# Patient Record
Sex: Male | Born: 1955 | Race: Black or African American | Hispanic: No | Marital: Married | Smoking: Never smoker
Health system: Southern US, Community
[De-identification: ages and names within clinical notes are randomized; demographics above are authoritative.]

## PROBLEM LIST (undated history)

## (undated) DIAGNOSIS — G4730 Sleep apnea, unspecified: Secondary | ICD-10-CM

## (undated) DIAGNOSIS — E785 Hyperlipidemia, unspecified: Secondary | ICD-10-CM

## (undated) HISTORY — DX: Hyperlipidemia, unspecified: E78.5

---

## 2004-06-17 ENCOUNTER — Encounter: Admission: RE | Admit: 2004-06-17 | Discharge: 2004-06-17 | Payer: Self-pay | Admitting: Family Medicine

## 2004-10-27 ENCOUNTER — Ambulatory Visit (HOSPITAL_COMMUNITY): Admission: RE | Admit: 2004-10-27 | Discharge: 2004-10-27 | Payer: Self-pay | Admitting: Gastroenterology

## 2009-07-05 ENCOUNTER — Ambulatory Visit: Payer: Self-pay | Admitting: Vascular Surgery

## 2010-10-03 ENCOUNTER — Encounter: Admission: RE | Admit: 2010-10-03 | Discharge: 2010-10-03 | Payer: Self-pay | Admitting: Family Medicine

## 2011-04-25 NOTE — Procedures (Signed)
DUPLEX DEEP VENOUS EXAM - LOWER EXTREMITY   INDICATION:  Painful veins in left lower extremity.   HISTORY:  Edema:  No.  Trauma/Surgery:  No.  Pain:  Patient complains of occasional burning sensation in the inner  left calf and thigh area.  PE:  No.  Previous DVT:  No.  Anticoagulants:  Other:   DUPLEX EXAM:                CFV   SFV   PopV  PTV    GSV                R  L  R  L  R  L  R   L  R  L  Thrombosis    o  o     o     o      o     o  Spontaneous   +  +     +     +      +     +  Phasic        +  +     +     +      +     +  Augmentation  +  +     +     +      +     +  Compressible  +  +     +     +      +     +  Competent     0  0     0     +      +     0   Legend:  + - yes  o - no  p - partial  D - decreased   IMPRESSION:  1. No evidence of deep vein thrombosis noted in the left lower      extremity.  2. Reflux is noted in the left greater saphenous, superficial femoral,      and bilateral common femoral veins.  3. A preliminary report was faxed to Dr. Randel Books office on      07/05/2009.        _____________________________  Larina Earthly, M.D.   CH/MEDQ  D:  07/05/2009  T:  07/06/2009  Job:  [id number]

## 2011-04-28 NOTE — Op Note (Signed)
NAME:  DODDSamantha, Olivera NO.:  [id number]   MEDICAL RECORD NO.:  [id number]          PATIENT TYPE:  AMB   LOCATION:  ENDO                         FACILITY:  [facility]   PHYSICIAN:  Danise Edge, M.D.   DATE OF BIRTH:  1956/09/07   DATE OF PROCEDURE:  10/27/2004  DATE OF DISCHARGE:                                 OPERATIVE REPORT   PROCEDURE:  Colonoscopy.   PROCEDURE INDICATION:  Mr. Ethon Wymer is a 55 year old male born April 01, 1956.  Mr. Fennell has a 10-year history of chronic constipation associated  with straining and the passage of fresh blood intermittently.  He has tried  a number of laxatives but nothing consistently.  Since switching from  caffeinated coffee to herbal tea, his constipation has markedly improved.  There is no personal or family history of colon cancer.   MEDICATION ALLERGIES:  None.   CURRENT MEDICATIONS:  Lipitor and aspirin.   PAST MEDICAL HISTORY:  Hypercholesterolemia.   SOCIAL HISTORY:  Mr. Ysaguirre is married and has two children.   HABITS:  Mr. Gover does not smoke cigarettes and consumes alcohol in  moderation.   ENDOSCOPIST:  Danise Edge, M.D.   PREMEDICATION:  Versed 7.5 mg, Demerol 50 mg.   PROCEDURE:  After obtaining informed consent, Mr. Meldrum was placed in the  left lateral decubitus position.  I administered intravenous Demerol and  intravenous Versed to achieve conscious sedation for the procedure.  The  patient's blood pressure, oxygen saturation, and cardiac rhythm were  monitored throughout the procedure and documented in the medical record.   Anal inspection and digital rectal exam were normal.  The prostate was non-  nodular.  The Olympus adjustable pediatric colonoscope was introduced into  the rectum and advanced to the cecum.  A normal-appearing ileocecal valve  was intubated and the distal ileum inspected.  Colonic preparation for the  exam today was excellent.   Rectum normal.   Sigmoid colon and  descending colon normal.   Splenic flexure normal.   Transverse colon normal.   Hepatic flexure normal.   Ascending colon normal.   Cecum and ileocecal valve normal.   Distal ileum normal.   ASSESSMENT:  Normal screening proctocolonoscopy to the cecum.  No endoscopic  evidence for the presence of colorectal neoplasia.      MJ/MEDQ  D:  10/27/2004  T:  10/27/2004  Job:  [id number]   cc:   Bryan Lemma. Manus Gunning, M.D.  [address] [place]  [place] [address]  Fax: [phone]

## 2011-06-28 IMAGING — US US EXTREM UP*L* LTD
1 series · 7 of 7 positions shown · non-contrast
Comparison: None.

CLINICAL DATA: Probable area in the left antecubital fossa

ULTRASOUND LEFT UPPER EXTREMITY COMPLETE
TECHNIQUE: Ultrasound examination of the left antecubital fossa
was performed including evaluation of the muscles, tendons, joint,
and adjacent soft tissues.

[Series 1: us extrem up*left* ltd · 0.05mm/px · 7 of 7 slices shown]
[im 1/7]
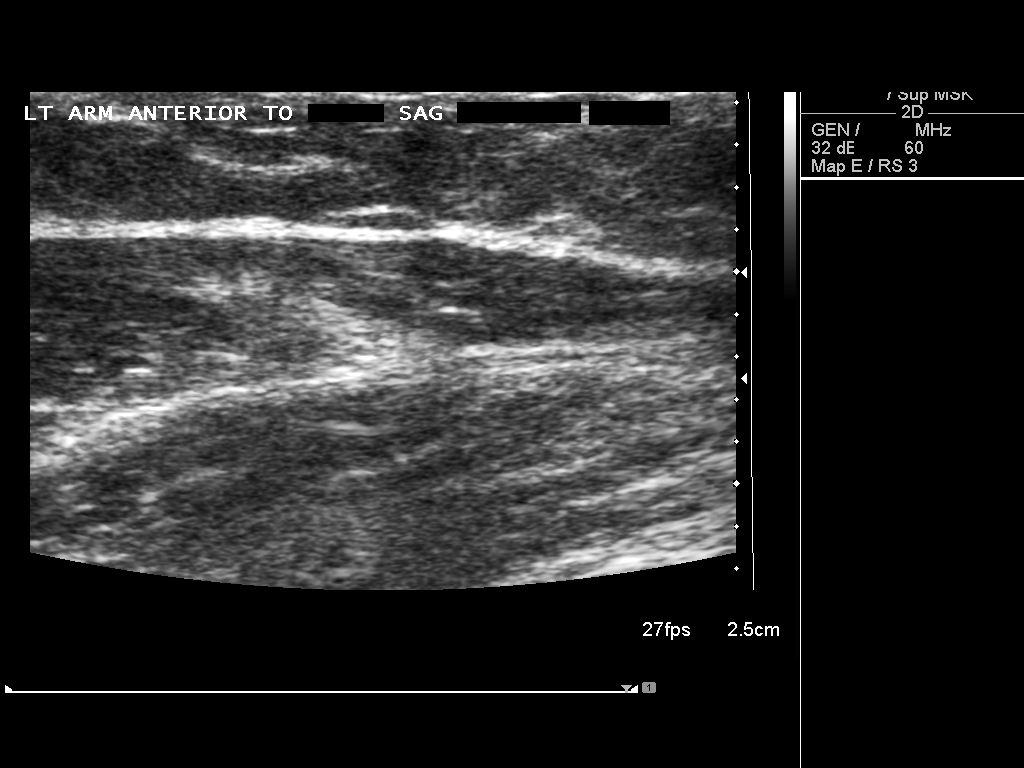
[im 2/7]
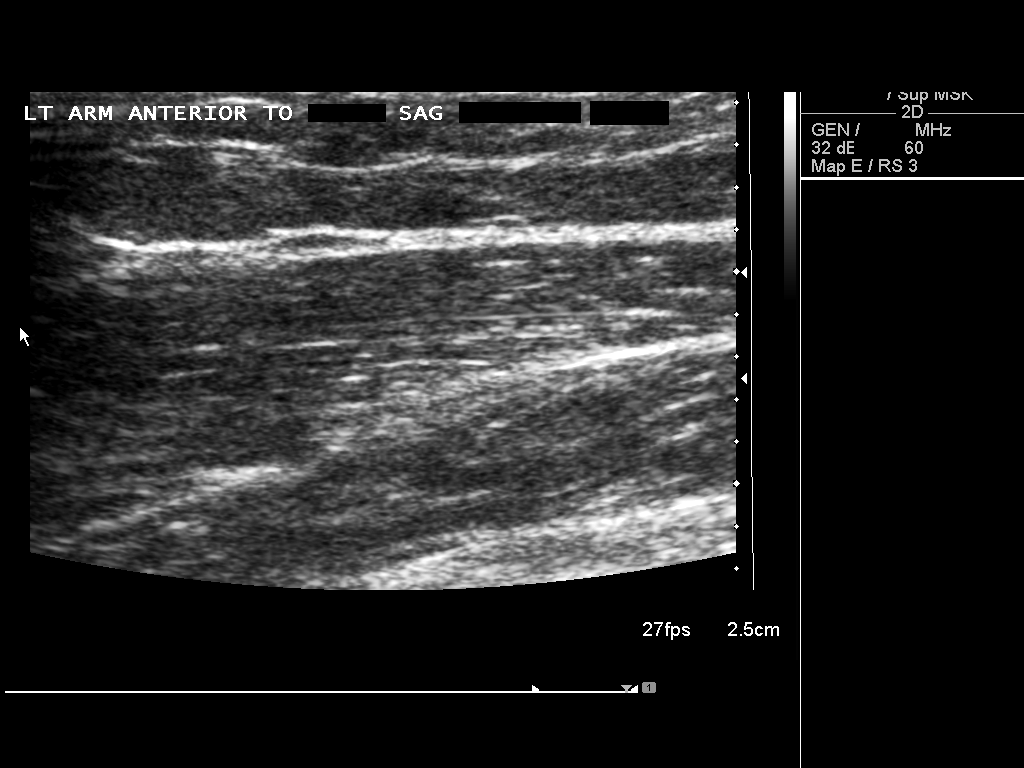
[im 3/7]
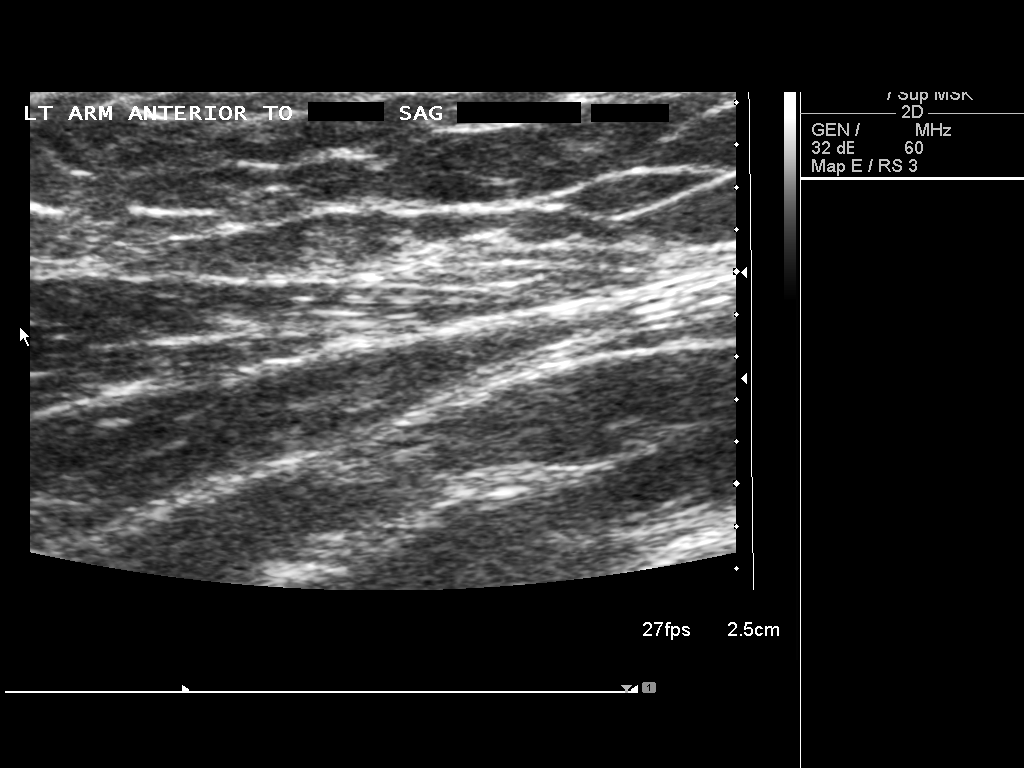
[im 4/7]
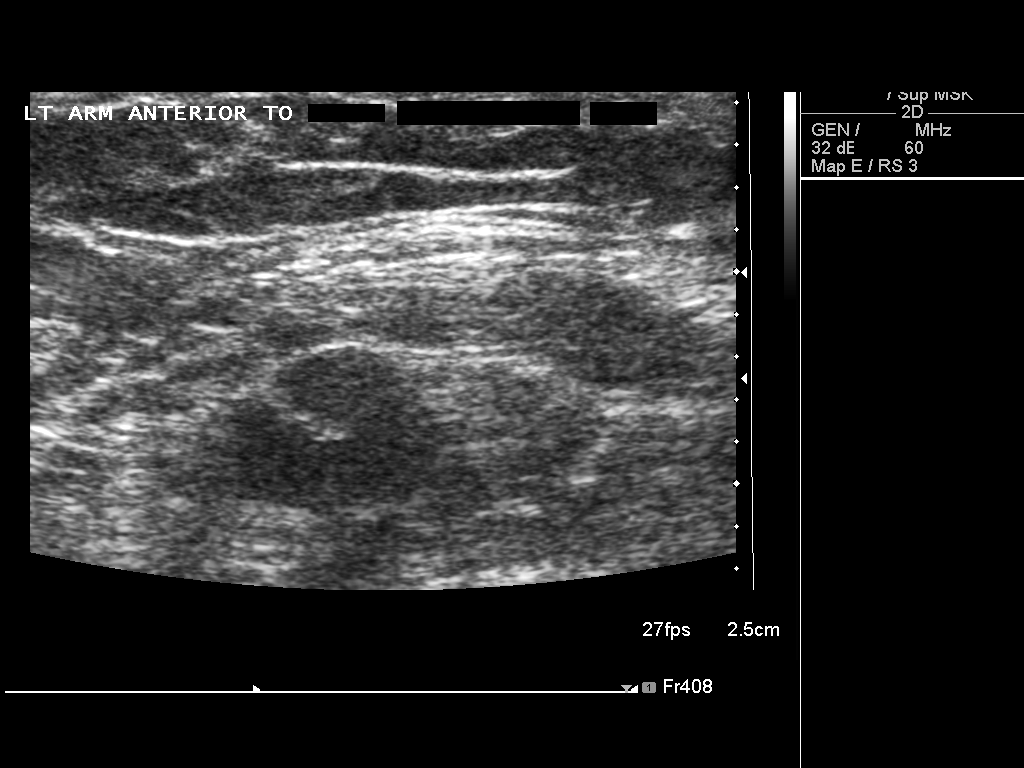
[im 5/7]
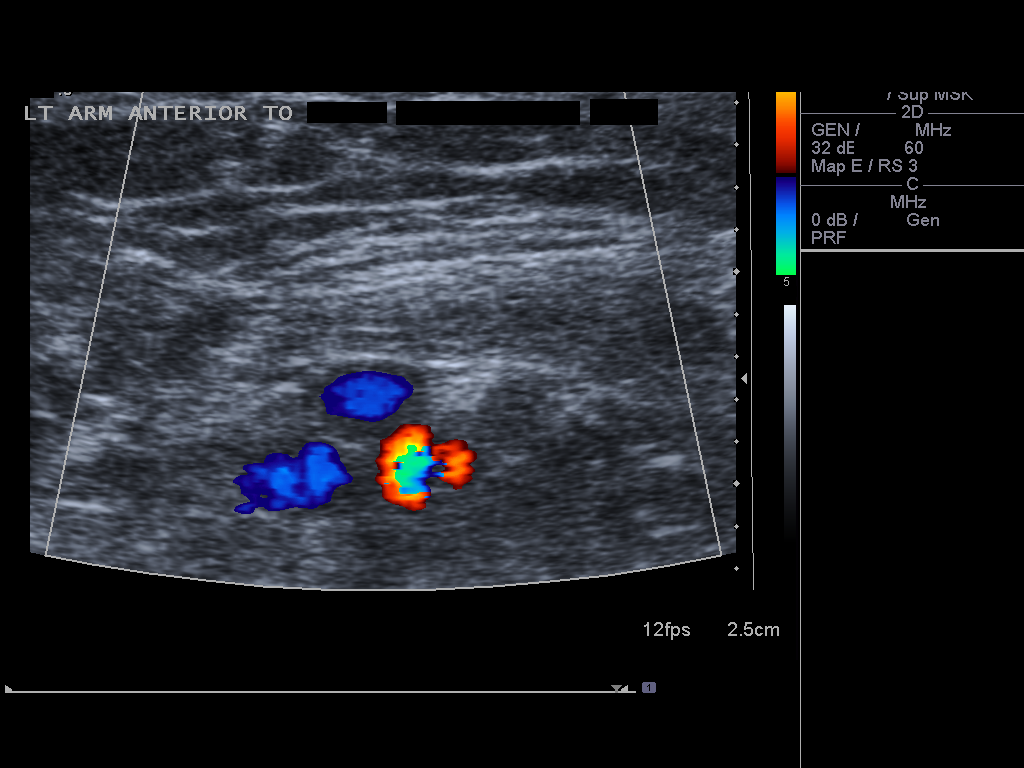
[im 6/7]
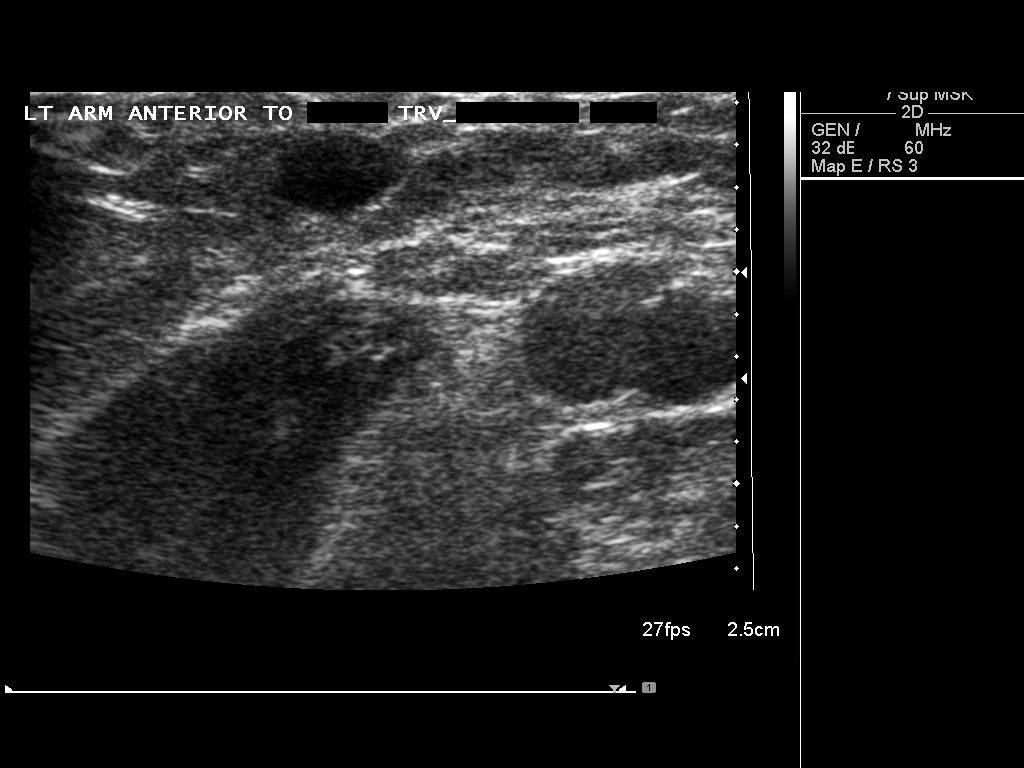
[im 7/7]
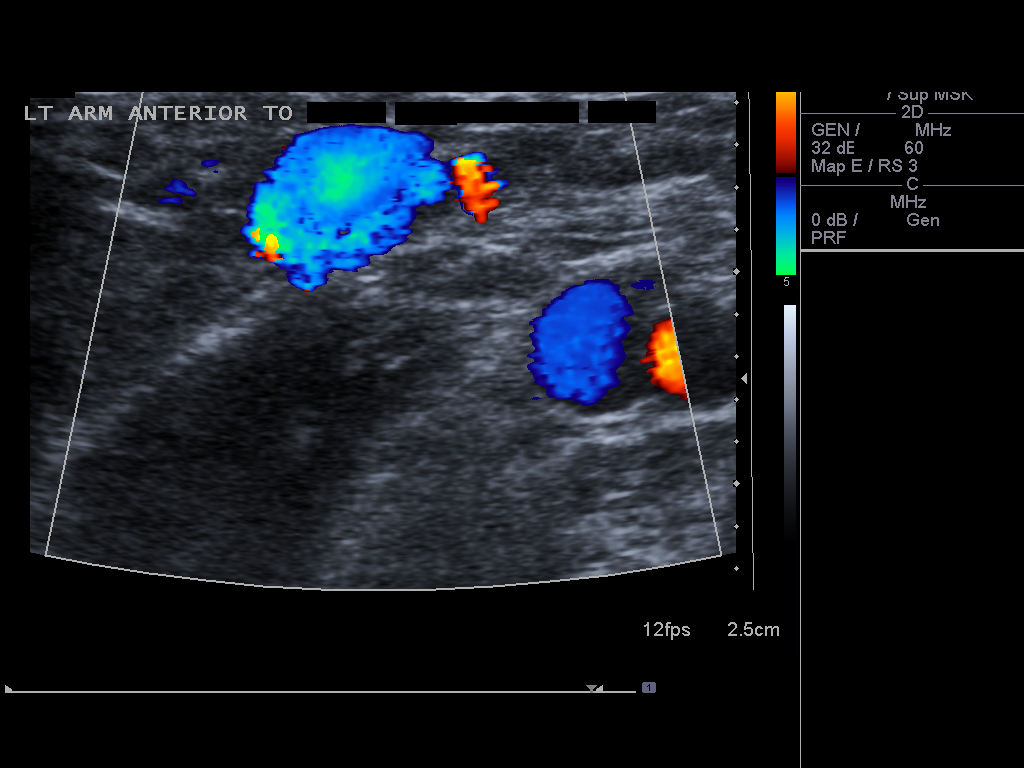

[7 of 7 positions shown; findings below may reference images not displayed]

FINDINGS: No solid or cystic abnormality is seen.  If this area
enlarges either CT or MRI would be recommended to assess further.
IMPRESSION: No solid or cystic abnormality at the site questioned clinically.

## 2014-10-20 ENCOUNTER — Other Ambulatory Visit: Payer: Self-pay | Admitting: Gastroenterology

## 2014-11-04 ENCOUNTER — Encounter (HOSPITAL_COMMUNITY): Payer: Self-pay | Admitting: *Deleted

## 2014-11-17 ENCOUNTER — Encounter (HOSPITAL_COMMUNITY): Payer: Self-pay | Admitting: *Deleted

## 2014-11-17 ENCOUNTER — Ambulatory Visit (HOSPITAL_COMMUNITY): Payer: 59 | Admitting: Anesthesiology

## 2014-11-17 ENCOUNTER — Encounter (HOSPITAL_COMMUNITY)
Admission: RE | Disposition: A | Discharge status: Home / Self Care | Payer: Self-pay | Source: Ambulatory Visit | Attending: Gastroenterology

## 2014-11-17 ENCOUNTER — Ambulatory Visit (HOSPITAL_COMMUNITY)
Admission: RE | Admit: 2014-11-17 | Discharge: 2014-11-17 | Disposition: A | Discharge status: Home / Self Care | Payer: 59 | Source: Ambulatory Visit | Attending: Gastroenterology | Admitting: Gastroenterology

## 2014-11-17 DIAGNOSIS — E669 Obesity, unspecified: Secondary | ICD-10-CM | POA: Diagnosis not present

## 2014-11-17 DIAGNOSIS — Z1211 Encounter for screening for malignant neoplasm of colon: Secondary | ICD-10-CM | POA: Insufficient documentation

## 2014-11-17 DIAGNOSIS — K5730 Diverticulosis of large intestine without perforation or abscess without bleeding: Secondary | ICD-10-CM | POA: Insufficient documentation

## 2014-11-17 DIAGNOSIS — Z6835 Body mass index (BMI) 35.0-35.9, adult: Secondary | ICD-10-CM | POA: Diagnosis not present

## 2014-11-17 DIAGNOSIS — E780 Pure hypercholesterolemia: Secondary | ICD-10-CM | POA: Insufficient documentation

## 2014-11-17 HISTORY — DX: Sleep apnea, unspecified: G47.30

## 2014-11-17 HISTORY — PX: COLONOSCOPY WITH PROPOFOL: SHX5780

## 2014-11-17 SURGERY — COLONOSCOPY WITH PROPOFOL
Anesthesia: Monitor Anesthesia Care

## 2014-11-17 MED ORDER — SODIUM CHLORIDE 0.9 % IV SOLN: INTRAVENOUS | Status: DC

## 2014-11-17 MED ORDER — PROPOFOL 10 MG/ML IV EMUL
INTRAVENOUS | Status: DC | PRN
  Administered 2014-11-17: 50 mg via INTRAVENOUS

## 2014-11-17 MED ORDER — LACTATED RINGERS IV SOLN
INTRAVENOUS | Status: DC
  Administered 2014-11-17: 1000 mL via INTRAVENOUS

## 2014-11-17 MED ORDER — PROPOFOL 10 MG/ML IV BOLUS
INTRAVENOUS | Status: AC
  Filled 2014-11-17: qty 20

## 2014-11-17 MED ORDER — LIDOCAINE HCL (CARDIAC) 20 MG/ML IV SOLN
INTRAVENOUS | Status: AC
  Filled 2014-11-17: qty 5

## 2014-11-17 MED ORDER — LACTATED RINGERS IV SOLN
INTRAVENOUS | Status: DC | PRN
  Administered 2014-11-17: 08:00:00 via INTRAVENOUS

## 2014-11-17 MED ORDER — PROPOFOL INFUSION 10 MG/ML OPTIME
INTRAVENOUS | Status: DC | PRN
  Administered 2014-11-17: 140 ug/kg/min via INTRAVENOUS

## 2014-11-17 SURGICAL SUPPLY — 21 items

## 2014-11-17 NOTE — Transfer of Care (Signed)
Immediate Anesthesia Transfer of Care Note  Patient: Matthew GladeDavid D Wrobel  Procedure(s) Performed: Procedure(s): COLONOSCOPY WITH PROPOFOL (N/A)  Patient Location: PACU  Anesthesia Type:MAC  Level of Consciousness: awake, alert  and oriented  Airway & Oxygen Therapy: Patient Spontanous Breathing and Patient connected to face mask oxygen  Post-op Assessment: Report given to PACU RN and Post -op Vital signs reviewed and stable  Post vital signs: Reviewed and stable  Complications: No apparent anesthesia complications

## 2014-11-17 NOTE — Anesthesia Postprocedure Evaluation (Signed)
Anesthesia Post Note  Patient: Matthew GladeDavid D Lamb  Procedure(s) Performed: Procedure(s) (LRB): COLONOSCOPY WITH PROPOFOL (N/A)  Anesthesia type: MAC  Patient location: PACU  Post pain: Pain level controlled  Post assessment: Post-op Vital signs reviewed  Last Vitals: BP 137/79 mmHg  Pulse 61  Temp(Src) 36.6 C (Oral)  Resp 17  Ht 5\' 10"  (1.778 m)  Wt 250 lb (113.399 kg)  BMI 35.87 kg/m2  SpO2 99%  Post vital signs: Reviewed  Level of consciousness: awake  Complications: No apparent anesthesia complications

## 2014-11-17 NOTE — H&P (Signed)
  Procedure: Screening colonoscopy. Normal screening colonoscopy performed on 10/27/2004.  History: The patient is a 58 year old male born October 21, 1956. He is scheduled to undergo a repeat screening colonoscopy with polypectomy to prevent colon cancer.  Medication allergies: None  Past medical history: Hypercholesterolemia.  Exam: The patient is alert and lying comfortably on the endoscopy stretcher. Abdomen is soft and nontender to palpation. Lungs are clear to auscultation. Cardiac exam reveals a regular rhythm.  Plan: Proceed with screening colonoscopy

## 2014-11-17 NOTE — Discharge Instructions (Signed)

## 2014-11-17 NOTE — Anesthesia Preprocedure Evaluation (Addendum)
Anesthesia Evaluation  Patient identified by MRN, date of birth, ID band Patient awake    Reviewed: Allergy & Precautions, H&P , NPO status , Patient's Chart, lab work & pertinent test results  Airway Mallampati: II  TM Distance: >3 FB Neck ROM: Full    Dental no notable dental hx.    Pulmonary sleep apnea ,  breath sounds clear to auscultation  Pulmonary exam normal       Cardiovascular negative cardio ROS  Rhythm:Regular Rate:Normal     Neuro/Psych negative neurological ROS  negative psych ROS   GI/Hepatic negative GI ROS, Neg liver ROS,   Endo/Other  negative endocrine ROS  Renal/GU negative Renal ROS     Musculoskeletal negative musculoskeletal ROS (+)   Abdominal (+) + obese,   Peds  Hematology negative hematology ROS (+)   Anesthesia Other Findings   Reproductive/Obstetrics                             Anesthesia Physical Anesthesia Plan  ASA: II  Anesthesia Plan: MAC   Post-op Pain Management:    Induction: Intravenous  Airway Management Planned:   Additional Equipment:   Intra-op Plan:   Post-operative Plan:   Informed Consent: I have reviewed the patients History and Physical, chart, labs and discussed the procedure including the risks, benefits and alternatives for the proposed anesthesia with the patient or authorized representative who has indicated his/her understanding and acceptance.   Dental advisory given  Plan Discussed with: CRNA  Anesthesia Plan Comments:         Anesthesia Quick Evaluation

## 2014-11-17 NOTE — Op Note (Signed)
Procedure: Screening colonoscopy. Normal screening colonoscopy performed on 10/27/2004  Endoscopist: Danise EdgeMartin Dalbert Stillings  Premedication: Propofol administered by anesthesia  Procedure: The patient was placed in the left lateral decubitus position. Anal inspection and digital rectal exam were normal. The Pentax pediatric colonoscope was introduced into the rectum and advanced to the cecum. A normal-appearing appendiceal orifice was identified. A normal-appearing ileocecal valve was identified. Colonic preparation for the exam today was good. Withdrawal time was 10 minutes.  Rectum. Normal. Retroflexed view of the distal rectum normal  Sigmoid colon and descending colon. Normal. A few colonic diverticula were noted  Splenic flexure. Normal  Transverse colon. Normal  Hepatic flexure. Normal  Ascending colon. Normal  Cecum and ileocecal valve. Normal  Assessment: Normal screening colonoscopy  Recommendation: Schedule repeat screening colonoscopy in 10 years

## 2014-11-18 ENCOUNTER — Encounter (HOSPITAL_COMMUNITY): Payer: Self-pay | Admitting: Gastroenterology

## 2017-01-06 DIAGNOSIS — Z23 Encounter for immunization: Secondary | ICD-10-CM | POA: Diagnosis not present

## 2017-02-01 DIAGNOSIS — Z0000 Encounter for general adult medical examination without abnormal findings: Secondary | ICD-10-CM | POA: Diagnosis not present

## 2017-02-01 DIAGNOSIS — Z125 Encounter for screening for malignant neoplasm of prostate: Secondary | ICD-10-CM | POA: Diagnosis not present

## 2017-02-01 DIAGNOSIS — R7301 Impaired fasting glucose: Secondary | ICD-10-CM | POA: Diagnosis not present

## 2017-02-01 DIAGNOSIS — E7800 Pure hypercholesterolemia, unspecified: Secondary | ICD-10-CM | POA: Diagnosis not present

## 2017-05-19 DIAGNOSIS — H40033 Anatomical narrow angle, bilateral: Secondary | ICD-10-CM | POA: Diagnosis not present

## 2017-05-19 DIAGNOSIS — H04123 Dry eye syndrome of bilateral lacrimal glands: Secondary | ICD-10-CM | POA: Diagnosis not present

## 2017-12-28 DIAGNOSIS — Z23 Encounter for immunization: Secondary | ICD-10-CM | POA: Diagnosis not present

## 2018-02-06 DIAGNOSIS — R7301 Impaired fasting glucose: Secondary | ICD-10-CM | POA: Diagnosis not present

## 2018-02-06 DIAGNOSIS — Z0000 Encounter for general adult medical examination without abnormal findings: Secondary | ICD-10-CM | POA: Diagnosis not present

## 2018-02-06 DIAGNOSIS — E7800 Pure hypercholesterolemia, unspecified: Secondary | ICD-10-CM | POA: Diagnosis not present

## 2018-08-22 DIAGNOSIS — H6123 Impacted cerumen, bilateral: Secondary | ICD-10-CM | POA: Diagnosis not present

## 2018-09-22 DIAGNOSIS — Z23 Encounter for immunization: Secondary | ICD-10-CM | POA: Diagnosis not present

## 2018-09-24 DIAGNOSIS — J019 Acute sinusitis, unspecified: Secondary | ICD-10-CM | POA: Diagnosis not present

## 2020-01-17 ENCOUNTER — Ambulatory Visit: Payer: Self-pay | Attending: Internal Medicine

## 2020-01-17 DIAGNOSIS — Z23 Encounter for immunization: Secondary | ICD-10-CM | POA: Insufficient documentation

## 2020-01-17 NOTE — Progress Notes (Signed)
   Covid-19 Vaccination Clinic  Name:  GERRELL TABET    MRN: [id number] DOB: 1956/03/30  01/17/2020  Mr. Gallina was observed post Covid-19 immunization for 15 minutes without incidence. He was provided with Vaccine Information Sheet and instruction to access the V-Safe system.   Mr. Perrier was instructed to call 911 with any severe reactions post vaccine: Marland Kitchen Difficulty breathing  . Swelling of your face and throat  . A fast heartbeat  . A bad rash all over your body  . Dizziness and weakness    Immunizations Administered    Name Date Dose VIS Date Route   Moderna COVID-19 Vaccine 01/17/2020 12:44 PM 0.5 mL 11/11/2019 Intramuscular   Manufacturer: Moderna   Lot: [id number]   NDC: 29980-699-96

## 2020-02-17 ENCOUNTER — Ambulatory Visit: Payer: Self-pay | Attending: Internal Medicine

## 2020-02-17 DIAGNOSIS — Z23 Encounter for immunization: Secondary | ICD-10-CM | POA: Insufficient documentation

## 2020-02-17 NOTE — Progress Notes (Signed)
   Covid-19 Vaccination Clinic  Name:  Matthew Lamb    MRN: [id number] DOB: 1956/10/25  02/17/2020  Mr. [name] was observed post Covid-19 immunization for 15 minutes without incident. He was provided with Vaccine Information Sheet and instruction to access the V-Safe system.   Mr. [name] was instructed to call 911 with any severe reactions post vaccine: Marland Kitchen Difficulty breathing  . Swelling of face and throat  . A fast heartbeat  . A bad rash all over body  . Dizziness and weakness   Immunizations Administered    Name Date Dose VIS Date Route   Moderna COVID-19 Vaccine 02/17/2020 12:14 PM 0.5 mL 11/11/2019 Intramuscular   Manufacturer: Moderna   Lot: [id number]   NDC: 63335-456-25

## 2020-09-08 ENCOUNTER — Ambulatory Visit: Payer: Medicare PPO | Admitting: Vascular Surgery

## 2020-09-08 ENCOUNTER — Other Ambulatory Visit: Payer: Self-pay

## 2020-09-08 ENCOUNTER — Encounter: Payer: Self-pay | Admitting: Vascular Surgery

## 2020-09-08 VITALS — BP 129/76 | HR 59 | Temp 97.2°F | Resp 18 | Ht 69.0 in | Wt 254.0 lb

## 2020-09-08 DIAGNOSIS — I83812 Varicose veins of left lower extremities with pain: Secondary | ICD-10-CM | POA: Diagnosis not present

## 2020-09-08 DIAGNOSIS — I8393 Asymptomatic varicose veins of bilateral lower extremities: Secondary | ICD-10-CM

## 2020-09-08 NOTE — Progress Notes (Signed)
Referring Physician: Dr Nelva Nay  Patient name: Matthew Lamb MRN: [id number] DOB: 1956-03-01 Sex: male  REASON FOR CONSULT: Symptomatic varicose veins with pain left leg  HPI: Matthew Lamb is a 64 y.o. male, with a several year history of slowly worsening varicose veins left leg.  He states that his left leg hurts almost every day.  It is an aching heaviness and fullness pain in the left leg.  Most of the pain is in the left medial calf area he also has some in the thigh.  He had an episode a few months ago where the veins in his left calf became very inflamed and is improved after several days of ice and elevation.  He also gets a cramping sensation in his left leg when he walks more than about 2 blocks at times.  He has not really consistently elevated his legs.  He has no prior history of using compression stockings.  He does have a family history of varicose veins in his mother.  He has no prior history of DVT.  He is a non-smoker.  Other medical problems include hyperlipidemia and sleep apnea which have been stable.  Past Medical History:  Diagnosis Date  . Hyperlipidemia   . Sleep apnea    no CPAP-in past   Past Surgical History:  Procedure Laterality Date  . COLONOSCOPY WITH PROPOFOL N/A 11/17/2014   Procedure: COLONOSCOPY WITH PROPOFOL;  Surgeon: Charolett Bumpers, MD;  Location: [facility];  Service: Endoscopy;  Laterality: N/A;    Family History  Problem Relation Age of Onset  . Varicose Veins Mother     SOCIAL HISTORY: Social History   Socioeconomic History  . Marital status: Married    Spouse name: Not on file  . Number of children: Not on file  . Years of education: Not on file  . Highest education level: Not on file  Occupational History  . Not on file  Tobacco Use  . Smoking status: Never Smoker  . Smokeless tobacco: Never Used  Substance and Sexual Activity  . Alcohol use: Yes    Alcohol/week: 1.0 standard drink    Types: 1 Cans of beer per week     Comment: socially  . Drug use: No  . Sexual activity: Not on file  Other Topics Concern  . Not on file  Social History Narrative  . Not on file   Social Determinants of Health   Financial Resource Strain:   . Difficulty of Paying Living Expenses: Not on file  Food Insecurity:   . Worried About Programme researcher, broadcasting/film/video in the Last Year: Not on file  . Ran Out of Food in the Last Year: Not on file  Transportation Needs:   . Lack of Transportation (Medical): Not on file  . Lack of Transportation (Non-Medical): Not on file  Physical Activity:   . Days of Exercise per Week: Not on file  . Minutes of Exercise per Session: Not on file  Stress:   . Feeling of Stress : Not on file  Social Connections:   . Frequency of Communication with Friends and Family: Not on file  . Frequency of Social Gatherings with Friends and Family: Not on file  . Attends Religious Services: Not on file  . Active Member of Clubs or Organizations: Not on file  . Attends Banker Meetings: Not on file  . Marital Status: Not on file  Intimate Partner Violence:   . Fear of Current or  Ex-Partner: Not on file  . Emotionally Abused: Not on file  . Physically Abused: Not on file  . Sexually Abused: Not on file    No Known Allergies  Current Outpatient Medications  Medication Sig Dispense Refill  . Multiple Vitamin (MULTIVITAMIN WITH MINERALS) TABS tablet Take 1 tablet by mouth every morning.    . pravastatin (PRAVACHOL) 40 MG tablet Take 40 mg by mouth at bedtime.     No current facility-administered medications for this visit.    ROS:   General:  No weight loss, Fever, chills  HEENT: No recent headaches, no nasal bleeding, no visual changes, no sore throat  Neurologic: No dizziness, blackouts, seizures. No recent symptoms of stroke or mini- stroke. No recent episodes of slurred speech, or temporary blindness.  Cardiac: No recent episodes of chest pain/pressure, no shortness of breath at rest.   No shortness of breath with exertion.  Denies history of atrial fibrillation or irregular heartbeat  Vascular: No history of rest pain in feet.  No history of claudication.  No history of non-healing ulcer, No history of DVT   Pulmonary: No home oxygen, no productive cough, no hemoptysis,  No asthma or wheezing  Musculoskeletal:  [ ]  Arthritis, [ ]  Low back pain,  [ ]  Joint pain  Hematologic:No history of hypercoagulable state.  No history of easy bleeding.  No history of anemia  Gastrointestinal: No hematochezia or melena,  No gastroesophageal reflux, no trouble swallowing  Urinary: [ ]  chronic Kidney disease, [ ]  on HD - [ ]  MWF or [ ]  TTHS, [ ]  Burning with urination, [ ]  Frequent urination, [ ]  Difficulty urinating;   Skin: No rashes  Psychological: No history of anxiety,  No history of depression   Physical Examination  Vitals:   09/08/20 0856  BP: 129/76  Pulse: (!) 59  Resp: 18  Temp: (!) 97.2 F (36.2 C)  TempSrc: Temporal  SpO2: 97%  Weight: 254 lb (115.2 kg)  Height: 5\' 9"  (1.753 m)    Body mass index is 37.51 kg/m.  General:  Alert and oriented, no acute distress HEENT: Normal Cardiac: Regular Rate and Rhythm Skin: No rash, multiple large varicosities left medial thigh left medial calf 4 to 5 mm diameter covering a surface area of about 10 cm in the left thigh and 5 cm in the left calf see image below       Extremity Pulses:  2+ radial, brachial, femoral, dorsalis pedis, posterior tibial pulses bilaterally Musculoskeletal: No deformity or edema  Neurologic: Upper and lower extremity motor 5/5 and symmetric  DATA:  I performed a SonoSite ultrasound at the bedside today.  This showed primarily a dilated accessory greater saphenous vein in the left leg.  This seemed to be the main feeder for the varicosities in his left thigh and calf.  ASSESSMENT: Symptomatic varicose veins with pain left leg.   PLAN: Patient was measured and fitted for compression  stockings today 20 to 30 mm thigh-high compression stockings.  He will also elevate his legs at the end of the day.  He will use ibuprofen intermittently for aches and pains.  He will follow up in 3 months time to see if he has had any improvement in his symptoms and a formal reflux duplex scan at that time.  , MD [facility] Office: [phone]

## 2020-09-14 ENCOUNTER — Other Ambulatory Visit: Payer: Self-pay | Admitting: *Deleted

## 2020-09-14 DIAGNOSIS — I83812 Varicose veins of left lower extremities with pain: Secondary | ICD-10-CM

## 2020-12-15 ENCOUNTER — Encounter (HOSPITAL_COMMUNITY): Payer: Medicare PPO

## 2020-12-15 ENCOUNTER — Ambulatory Visit: Payer: Medicare PPO | Admitting: Vascular Surgery

## 2022-06-13 ENCOUNTER — Emergency Department (HOSPITAL_BASED_OUTPATIENT_CLINIC_OR_DEPARTMENT_OTHER): Payer: No Typology Code available for payment source | Admitting: Radiology

## 2022-06-13 ENCOUNTER — Emergency Department (HOSPITAL_BASED_OUTPATIENT_CLINIC_OR_DEPARTMENT_OTHER): Payer: No Typology Code available for payment source

## 2022-06-13 ENCOUNTER — Other Ambulatory Visit: Payer: Self-pay

## 2022-06-13 ENCOUNTER — Emergency Department (HOSPITAL_BASED_OUTPATIENT_CLINIC_OR_DEPARTMENT_OTHER)
Admission: EM | Admit: 2022-06-13 | Discharge: 2022-06-13 | Disposition: A | Discharge status: Home / Self Care | Payer: No Typology Code available for payment source | Attending: Emergency Medicine | Admitting: Emergency Medicine

## 2022-06-13 DIAGNOSIS — S060X0A Concussion without loss of consciousness, initial encounter: Secondary | ICD-10-CM | POA: Diagnosis not present

## 2022-06-13 DIAGNOSIS — M79641 Pain in right hand: Secondary | ICD-10-CM | POA: Diagnosis not present

## 2022-06-13 DIAGNOSIS — S6291XA Unspecified fracture of right wrist and hand, initial encounter for closed fracture: Secondary | ICD-10-CM | POA: Insufficient documentation

## 2022-06-13 DIAGNOSIS — S0990XA Unspecified injury of head, initial encounter: Secondary | ICD-10-CM | POA: Diagnosis present

## 2022-06-13 DIAGNOSIS — W19XXXA Unspecified fall, initial encounter: Secondary | ICD-10-CM | POA: Insufficient documentation

## 2022-06-13 DIAGNOSIS — S62101A Fracture of unspecified carpal bone, right wrist, initial encounter for closed fracture: Secondary | ICD-10-CM

## 2022-06-13 MED ORDER — ACETAMINOPHEN 500 MG PO TABS
1000.0000 mg | ORAL_TABLET | Freq: Once | ORAL | Status: AC
  Administered 2022-06-13: 1000 mg via ORAL
  Filled 2022-06-13: qty 2

## 2022-06-13 MED ORDER — IBUPROFEN 600 MG PO TABS: 600.0000 mg | ORAL_TABLET | Freq: Four times a day (QID) | ORAL | 0 refills | Status: AC | PRN

## 2022-06-13 MED ORDER — OXYCODONE HCL 5 MG PO TABS
5.0000 mg | ORAL_TABLET | Freq: Once | ORAL | Status: AC
  Administered 2022-06-13: 5 mg via ORAL
  Filled 2022-06-13: qty 1

## 2022-06-13 MED ORDER — IBUPROFEN 800 MG PO TABS
800.0000 mg | ORAL_TABLET | Freq: Once | ORAL | Status: AC
  Administered 2022-06-13: 800 mg via ORAL
  Filled 2022-06-13: qty 1

## 2022-06-13 MED ORDER — OXYCODONE HCL 5 MG PO TABS: 5.0000 mg | ORAL_TABLET | Freq: Four times a day (QID) | ORAL | 0 refills | Status: AC | PRN

## 2022-06-13 NOTE — ED Triage Notes (Addendum)
Patient arrives with complaints of arm/neck/right knee and bilateral hand pain after a fall this morning. Patient states that his right knee gave out the restroom and he fell, hitting his head.  Rates pain a 8/10.

## 2022-06-13 NOTE — ED Provider Notes (Signed)
[facility] EMERGENCY DEPT Provider Note   CSN: [id number] Arrival date & time: 06/13/22  1221     History  Chief Complaint  Patient presents with   Fall   Hand Pain    Matthew Lamb is a 66 y.o. male presenting to the ED with fall, hand pain, headache.  Reports his knee buckled in the bathroom last night, fell and struck head, fall onto both outstretched hands, having pain in hands.  HPI     Home Medications Prior to Admission medications   Medication Sig Start Date End Date Taking? Authorizing Provider  ibuprofen (ADVIL) 600 MG tablet Take 1 tablet (600 mg total) by mouth every 6 (six) hours as needed for mild pain or moderate pain. 06/13/22 07/13/22 Yes Karenann Mcgrory, Kermit Balo, MD  oxyCODONE (ROXICODONE) 5 MG immediate release tablet Take 1 tablet (5 mg total) by mouth every 6 (six) hours as needed for up to 12 doses for severe pain. 06/13/22  Yes Barret Esquivel, Kermit Balo, MD  Multiple Vitamin (MULTIVITAMIN WITH MINERALS) TABS tablet Take 1 tablet by mouth every morning.    [provider]  pravastatin (PRAVACHOL) 40 MG tablet Take 40 mg by mouth at bedtime.    [provider]      Allergies    Patient has no known allergies.    Review of Systems   Review of Systems  Physical Exam Updated Vital Signs BP (!) 143/77 (BP Location: Left Arm)   Pulse 62   Temp 99.2 F (37.3 C) (Oral)   Resp 20   Ht 5\' 9"  (1.753 m)   Wt 109.3 kg   SpO2 98%   BMI 35.59 kg/m  Physical Exam Constitutional:      General: He is not in acute distress. HENT:     Head: Normocephalic and atraumatic.  Eyes:     Conjunctiva/sclera: Conjunctivae normal.     Pupils: Pupils are equal, round, and reactive to light.  Cardiovascular:     Rate and Rhythm: Normal rate and regular rhythm.     Pulses: Normal pulses.  Pulmonary:     Effort: Pulmonary effort is normal. No respiratory distress.  Abdominal:     General: There is no distension.     Tenderness: There is no abdominal  tenderness.  Musculoskeletal:     Comments: Swelling and diffuse tenderness of right wrist, limited mobility 2/2 pain Left wrist improved mobility, but tenderness along lateral aspect of wrist Full ROM of the lower extremities, able ot ambulate  Skin:    General: Skin is warm and dry.  Neurological:     General: No focal deficit present.     Mental Status: He is alert and oriented to person, place, and time. Mental status is at baseline.  Psychiatric:        Mood and Affect: Mood normal.        Behavior: Behavior normal.     ED Results / Procedures / Treatments   Labs (all labs ordered are listed, but only abnormal results are displayed) Labs Reviewed - No data to display  EKG None  Radiology CT Head Wo Contrast  Result Date: 06/13/2022 CLINICAL DATA:  Head trauma, minor (Age >= 65y); Neck trauma (Age >= 65y) EXAM: CT HEAD WITHOUT CONTRAST CT CERVICAL SPINE WITHOUT CONTRAST TECHNIQUE: Multidetector CT imaging of the head and cervical spine was performed following the standard protocol without intravenous contrast. Multiplanar CT image reconstructions of the cervical spine were also generated. RADIATION DOSE REDUCTION: This exam was  performed according to the departmental dose-optimization program which includes automated exposure control, adjustment of the mA and/or kV according to patient size and/or use of iterative reconstruction technique. COMPARISON:  None Available. FINDINGS: CT HEAD FINDINGS Brain: No evidence of large-territorial acute infarction. No parenchymal hemorrhage. No mass lesion. No extra-axial collection. No mass effect or midline shift. No hydrocephalus. Basilar cisterns are patent. Vascular: No hyperdense vessel. Skull: No acute fracture or focal lesion. Sinuses/Orbits: Paranasal sinuses and mastoid air cells are clear. The orbits are unremarkable. Other: None. CT CERVICAL SPINE FINDINGS Alignment: Normal. Skull base and vertebrae: Multilevel bulky osteophyte formation.  Posterior disc osteophyte complex formation at the C4-C5 level. No acute fracture. No aggressive appearing focal osseous lesion or focal pathologic process. Soft tissues and spinal canal: No prevertebral fluid or swelling. No visible canal hematoma. Upper chest: Unremarkable. Other: Dermal calcification (4:15). IMPRESSION: 1. No acute intracranial abnormality. 2. No acute displaced fracture or traumatic listhesis of the cervical spine. Electronically Signed   By: Tish Frederickson M.D.   On: 06/13/2022 15:07   CT Cervical Spine Wo Contrast  Result Date: 06/13/2022 CLINICAL DATA:  Head trauma, minor (Age >= 65y); Neck trauma (Age >= 65y) EXAM: CT HEAD WITHOUT CONTRAST CT CERVICAL SPINE WITHOUT CONTRAST TECHNIQUE: Multidetector CT imaging of the head and cervical spine was performed following the standard protocol without intravenous contrast. Multiplanar CT image reconstructions of the cervical spine were also generated. RADIATION DOSE REDUCTION: This exam was performed according to the departmental dose-optimization program which includes automated exposure control, adjustment of the mA and/or kV according to patient size and/or use of iterative reconstruction technique. COMPARISON:  None Available. FINDINGS: CT HEAD FINDINGS Brain: No evidence of large-territorial acute infarction. No parenchymal hemorrhage. No mass lesion. No extra-axial collection. No mass effect or midline shift. No hydrocephalus. Basilar cisterns are patent. Vascular: No hyperdense vessel. Skull: No acute fracture or focal lesion. Sinuses/Orbits: Paranasal sinuses and mastoid air cells are clear. The orbits are unremarkable. Other: None. CT CERVICAL SPINE FINDINGS Alignment: Normal. Skull base and vertebrae: Multilevel bulky osteophyte formation. Posterior disc osteophyte complex formation at the C4-C5 level. No acute fracture. No aggressive appearing focal osseous lesion or focal pathologic process. Soft tissues and spinal canal: No  prevertebral fluid or swelling. No visible canal hematoma. Upper chest: Unremarkable. Other: Dermal calcification (4:15). IMPRESSION: 1. No acute intracranial abnormality. 2. No acute displaced fracture or traumatic listhesis of the cervical spine. Electronically Signed   By: Tish Frederickson M.D.   On: 06/13/2022 15:07   DG Hand Complete Right  Result Date: 06/13/2022 CLINICAL DATA:  Acute RIGHT hand pain following fall. Initial encounter. EXAM: RIGHT HAND - COMPLETE 3 VIEW COMPARISON:  None Available. FINDINGS: A linear lucency within a posterior bone in the distal wrist is noted on the LATERAL view and may represent a nondisplaced fracture, possibly of the triquetrum. Correlate with pain. No other fracture, subluxation or dislocation identified. IMPRESSION: Equivocal nondisplaced fracture within a posterior bone in the distal wrist, possibly of the triquetrum. Correlate with pain. Electronically Signed   By: Harmon Pier M.D.   On: 06/13/2022 14:07   DG Hand Complete Left  Result Date: 06/13/2022 CLINICAL DATA:  Acute LEFT hand pain following fall. Initial encounter. EXAM: LEFT HAND - COMPLETE 3+ VIEW COMPARISON:  None Available. FINDINGS: There is no evidence of fracture or dislocation. There is no evidence of arthropathy or other focal bone abnormality. Soft tissues are unremarkable. IMPRESSION: Negative. Electronically Signed   By: Harmon Pier  M.D.   On: 06/13/2022 14:04   DG Knee Right Port  Result Date: 06/13/2022 CLINICAL DATA:  Acute RIGHT knee pain following fall. Initial encounter. EXAM: PORTABLE RIGHT KNEE - 1-2 VIEW COMPARISON:  None Available. FINDINGS: No acute fracture, subluxation or dislocation identified. Mild tricompartmental joint space narrowing and degenerative spurring of the tibial spines and patella noted. There is no evidence of joint effusion. IMPRESSION: No acute abnormality. Electronically Signed   By: Harmon Pier M.D.   On: 06/13/2022 14:01    Procedures Procedures     Medications Ordered in ED Medications  ibuprofen (ADVIL) tablet 800 mg (has no administration in time range)  acetaminophen (TYLENOL) tablet 1,000 mg (has no administration in time range)  oxyCODONE (Oxy IR/ROXICODONE) immediate release tablet 5 mg (has no administration in time range)    ED Course/ Medical Decision Making/ A&P                           Medical Decision Making Amount and/or Complexity of Data Reviewed Radiology: ordered.  Risk OTC drugs. Prescription drug management.   Mechanical fall CT and xray imaging personally interpreted showing right wrist fracture.  No ICH or spinal fx.  No knee fx. No visible left wrist fx, but given his degree of pain and tenderness, difficult to exclude small fx, will place RIGHT wrist in sugar tong splint and left in wrist brace so that he continues to have some level of mobility.  His wife will take him home.  Pain meds prescribed.    Likely mild concussion - lightheadedness - advised not to drive in this physical condition.        Final Clinical Impression(s) / ED Diagnoses Final diagnoses:  Closed fracture of right wrist, initial encounter  Concussion without loss of consciousness, initial encounter    Rx / DC Orders ED Discharge Orders          Ordered    oxyCODONE (ROXICODONE) 5 MG immediate release tablet  Every 6 hours PRN        06/13/22 1558    ibuprofen (ADVIL) 600 MG tablet  Every 6 hours PRN        06/13/22 1558              Shyonna Carlin, Kermit Balo, MD 06/13/22 1600

## 2022-06-13 NOTE — ED Notes (Signed)
Patient transported to CT 

## 2022-06-13 NOTE — ED Notes (Signed)
Reviewed AVS/discharge instruction with patient and wife. Time allotted for and all questions answered. Patient and spouse agreeable for d/c and escorted to ed exit by staff.

## 2022-06-13 NOTE — Discharge Instructions (Addendum)
Do not drive until you have normal function of both hands and do not have concussion symptoms.  Most people recover from a concussion in 2-4 weeks.

## 2023-07-14 ENCOUNTER — Other Ambulatory Visit (HOSPITAL_BASED_OUTPATIENT_CLINIC_OR_DEPARTMENT_OTHER): Payer: Self-pay

## 2023-07-14 ENCOUNTER — Other Ambulatory Visit: Payer: Self-pay

## 2023-07-14 ENCOUNTER — Emergency Department (HOSPITAL_BASED_OUTPATIENT_CLINIC_OR_DEPARTMENT_OTHER): Payer: No Typology Code available for payment source

## 2023-07-14 ENCOUNTER — Emergency Department (HOSPITAL_BASED_OUTPATIENT_CLINIC_OR_DEPARTMENT_OTHER)
Admission: EM | Admit: 2023-07-14 | Discharge: 2023-07-14 | Disposition: A | Discharge status: Home / Self Care | Payer: No Typology Code available for payment source | Attending: Emergency Medicine | Admitting: Emergency Medicine

## 2023-07-14 DIAGNOSIS — M545 Low back pain, unspecified: Secondary | ICD-10-CM | POA: Diagnosis present

## 2023-07-14 DIAGNOSIS — W11XXXA Fall on and from ladder, initial encounter: Secondary | ICD-10-CM | POA: Diagnosis not present

## 2023-07-14 MED ORDER — IBUPROFEN 800 MG PO TABS
800.0000 mg | ORAL_TABLET | Freq: Three times a day (TID) | ORAL | 0 refills | Status: AC | PRN
  Filled 2023-07-14: qty 21, 7d supply, fill #0

## 2023-07-14 MED ORDER — CYCLOBENZAPRINE HCL 10 MG PO TABS
10.0000 mg | ORAL_TABLET | Freq: Two times a day (BID) | ORAL | 0 refills | Status: AC | PRN
  Filled 2023-07-14: qty 14, 7d supply, fill #0

## 2023-07-14 NOTE — ED Provider Notes (Signed)
[name] Provider Note   CSN: [id number] Arrival date & time: 07/14/23  0945     History  Chief Complaint  Patient presents with   Fall   Back Pain    Matthew Lamb is a 67 y.o. male presenting today with complaint of back pain after falling off of a ladder.  Patient reports that he missed the bottom step and he fell backwards, and injured his back.  This occurred yesterday.  Patient reports that he did not have an immediate pain in his back, was able to get up and go to the house.  Later in the evening began having gradual pain across his back, bilaterally radiating into the upper side of his hip.  It does not travel down his legs.  He took naproxen last night which seemed to help but this morning the pain came back.  He took it into milligrams of ibuprofen prior to coming to the ED.  Pain is worse with walking.  He denies history of spinal surgery.  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  He denies any bowel or bladder incontinence  HPI     Home Medications Prior to Admission medications   Medication Sig Start Date End Date Taking? Authorizing Provider  cyclobenzaprine (FLEXERIL) 10 MG tablet Take 1 tablet (10 mg total) by mouth 2 (two) times daily as needed for up to 14 doses for muscle spasms. 07/14/23  Yes Terald Sleeper, MD  ibuprofen (ADVIL) 800 MG tablet Take 1 tablet (800 mg total) by mouth every 8 (eight) hours as needed for up to 21 doses. 07/14/23  Yes Mackensi Mahadeo, Kermit Balo, MD  Multiple Vitamin (MULTIVITAMIN WITH MINERALS) TABS tablet Take 1 tablet by mouth every morning.    [provider]  oxyCODONE (ROXICODONE) 5 MG immediate release tablet Take 1 tablet (5 mg total) by mouth every 6 (six) hours as needed for up to 12 doses for severe pain. 06/13/22   Terald Sleeper, MD  pravastatin (PRAVACHOL) 40 MG tablet Take 40 mg by mouth at bedtime.     [provider]      Allergies    Patient has no known allergies.    Review of Systems   Review of Systems  Physical Exam Updated Vital Signs BP (!) 157/84 (BP Location: Left Arm)   Pulse 62   Temp 98.6 F (37 C) (Oral)   Resp 20   Ht 5\' 9"  (1.753 m)   Wt 111.1 kg   SpO2 100%   BMI 36.18 kg/m  Physical Exam  ED Results / Procedures / Treatments   Labs (all labs ordered are listed, but only abnormal results are displayed) Labs Reviewed - No data to display  EKG None  Radiology CT Lumbar Spine Wo Contrast  Result Date: 07/14/2023 CLINICAL DATA:  Larey Seat from a ladder with severe back pain EXAM: CT LUMBAR SPINE WITHOUT CONTRAST TECHNIQUE: Multidetector CT imaging of the lumbar spine was performed without intravenous contrast administration. Multiplanar CT image reconstructions were also generated. RADIATION DOSE REDUCTION: This exam was performed according to the departmental dose-optimization program which includes automated exposure control, adjustment of the mA and/or kV according to patient size and/or use of iterative reconstruction technique. COMPARISON:  None Available. FINDINGS: Segmentation: 5 lumbar type vertebral bodies. Alignment: Normal Vertebrae: No regional fracture or focal bone lesion. Ordinary bridging osteophytes. Paraspinal and other soft tissues: No significant  paravertebral finding. Disc levels: No disc level finding of significance. No disc herniation is visible by CT. No apparent compressive stenosis of the canal or foramina. Mild facet osteoarthritis. 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 IMPRESSION: 1. No acute or traumatic finding. No evidence of regional fracture. Ordinary bridging osteophytes within the lumbar region. No disc level finding of significance. No apparent compressive stenosis of the canal or foramina. 2. 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  Electronically Signed   By: Paulina Fusi M.D.   On: 07/14/2023 10:38    Procedures Procedures    Medications Ordered in ED Medications - No data to display  ED Course/ Medical Decision Making/ A&P                                 Medical Decision Making Amount and/or Complexity of Data Reviewed Radiology: ordered.  Risk Prescription drug management.   Patient presenting with low back pain after fall.  Differential includes muscle spasm versus radiculopathy versus spinal fracture versus other.  CT imaging was ordered and personally reviewed interpreted, with no acute fracture.  He does have sacroiliac arthritis.  No red flags for cauda equina syndrome or indication for MRI at this time.  Patient was offered medications for pain in the ED and refused these.  He feels that his pain is minimal enough that he would do fine with ibuprofen at home.  We can also offer Flexeril as a muscle relaxer as there is likely some paralumbar muscle component as well.  Anticipate he ought to feel better after a week of conservative medications including heating packs at home.  He and his wife verbalized understanding.  They can follow-up with her PCP otherwise.  He is stable for discharge.        Final Clinical Impression(s) / ED Diagnoses Final diagnoses:  Acute bilateral low back pain without sciatica    Rx / DC Orders ED Discharge Orders          Ordered    cyclobenzaprine (FLEXERIL) 10 MG tablet  2 times daily PRN        07/14/23 1110    ibuprofen (ADVIL) 800 MG tablet  Every 8 hours PRN        07/14/23 1110              Terald Sleeper, MD 07/14/23 1112

## 2023-07-14 NOTE — Discharge Instructions (Addendum)
Your CT scan did not show any broken bones in your lower back.  You do have arthritis near your sacroiliac joint, which is where the lower spine fuses with your pelvic bone.  I gave you a muscle relaxer for your low back pain, as well as ibuprofen which you should take with food.  I expect your symptoms to improve over the next 7 days.  Please follow-up with your primary care provider clinic if you are continuing to have concerns.

## 2023-07-14 NOTE — ED Triage Notes (Addendum)
Patient arrives with complaints of severe back pain due to falling of a ladder. Patients states he fell straight off the second step from the bottom, when he missed the bottom step. Having increased pain when standing and walking.   Took 800mg  of Ibuprofen prior to arrival

## 2023-12-26 ENCOUNTER — Telehealth: Payer: Self-pay

## 2023-12-26 NOTE — Telephone Encounter (Signed)
 Lm for patient to ask if he has a disc with CT from [place].

## 2023-12-27 ENCOUNTER — Ambulatory Visit: Payer: No Typology Code available for payment source | Admitting: Emergency Medicine

## 2023-12-27 ENCOUNTER — Encounter: Payer: Self-pay | Admitting: Emergency Medicine

## 2023-12-27 VITALS — BP 141/83 | HR 64 | Ht 69.0 in | Wt 249.0 lb

## 2023-12-27 DIAGNOSIS — R918 Other nonspecific abnormal finding of lung field: Secondary | ICD-10-CM

## 2023-12-27 NOTE — Assessment & Plan Note (Signed)
Scattered micronodular disease especially at the left base as well as a more discrete 5 mm pulmonary nodule in the left lower lobe.  That nodule is stable over a 1 year interval.  He has low risk, is a never smoker.  You can make an argument to defer any follow-up and deem the nodule benign.  Given the micronodular disease, question possible smoldering inflammatory process, I think it is reasonable to check another CT in 1 year.  If we have 2 years of stability then we can probably defer any further follow-up unless he develops symptoms.  We reviewed your CT scans of the chest today. Please get a repeat CT scan of the chest without contrast at the [place] in November 2025 to compare with your priors. Please call our office and follow-up if you develop any new respiratory symptoms including change in breathing, cough, mucus production.  Call if you develop any systemic symptoms like fevers, chills, sweats.  If these symptoms develop then we might decide to check your CT scan of the chest sooner Follow Dr. Delton Coombes in December 2025 to review your CT scan results.

## 2023-12-27 NOTE — Patient Instructions (Addendum)
We reviewed your CT scans of the chest today. Please get a repeat CT scan of the chest without contrast at the [place] in November 2025 to compare with your priors. Please call our office and follow-up if you develop any new respiratory symptoms including change in breathing, cough, mucus production.  Call if you develop any systemic symptoms like fevers, chills, sweats.  If these symptoms develop then we might decide to check your CT scan of the chest sooner Follow Dr. Delton Coombes in December 2025 to review your CT scan results.

## 2023-12-27 NOTE — Progress Notes (Signed)
Subjective:    Patient ID: Matthew Lamb, male    DOB: 05-Nov-1956, 68 y.o.   MRN: [id number]  HPI 68 year old never smoker with a history of hyperlipidemia and obstructive sleep apnea not currently on CPAP. He is referred today for evaluation of pulmonary nodules on CT scan of the chest.  Never smoker. No lung CA in the family. Second hand smoke [employer] - some radiation exposure. 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   No dyspnea, can be active. No cough. No constitutional sx.   CT chest 10/09/2022 from the [place] reviewed by me, shows a small peripheral left lower lobe pulmonary nodule approximately 4.5 mm as well as some clustered micronodular disease in a tree-in-bud pattern at the left base  CT chest 10/15/2023 from the [place] reviewed by me shows the left lower lobe pulmonary nodule, similar in size, possibly more solid on this study.  The left basilar tree-in-bud nodularity is unchanged   Review of Systems As per HPI  Past Medical History:  Diagnosis Date   Hyperlipidemia    Sleep apnea    no CPAP-in past     Family History  Problem Relation Age of Onset   Varicose Veins Mother      Social History   Socioeconomic History   Marital status: Married    Spouse name: Not on file   Number of children: Not on file   Years of education: Not on file   Highest education level: Not on file  Occupational History   Not on file  Tobacco Use   Smoking status: Never   Smokeless tobacco: Never  Substance and Sexual Activity   Alcohol use: Yes    Alcohol/week: 1.0 standard drink of alcohol    Types: 1 Cans of beer per week    Comment: socially   Drug use: No   Sexual activity: Not on file  Other Topics Concern   Not on file  Social History Narrative   Not on file   Social Drivers of Health   Financial Resource Strain: Not on file  Food  Insecurity: Not on file  Transportation Needs: Not on file  Physical Activity: Not on file  Stress: Not on file  Social Connections: Unknown (04/25/2022)   Received from [facility]   Social Network    Social Network: Not on file  Intimate Partner Violence: Unknown (03/16/2022)   Received from [facility], [facility]   HITS    Physically Hurt: Not on file    Insult or Talk Down To: Not on file    Threaten Physical Harm: Not on file    Scream or Curse: Not on file     No Known Allergies   Outpatient Medications Prior to Visit  Medication Sig Dispense Refill   ibuprofen (ADVIL) 800 MG tablet Take 1 tablet (800 mg total) by mouth every 8 (eight) hours as needed for up to 21 doses. 21 tablet 0   Multiple Vitamin (MULTIVITAMIN WITH MINERALS) TABS tablet Take 1 tablet by mouth every morning.     pravastatin (PRAVACHOL) 40 MG tablet Take 40 mg by mouth at bedtime.     cyclobenzaprine (FLEXERIL) 10 MG tablet Take 1 tablet (10 mg total) by mouth 2 (two) times daily as needed for up to 14 doses for muscle spasms. 14 tablet 0   oxyCODONE (  ROXICODONE) 5 MG immediate release tablet Take 1 tablet (5 mg total) by mouth every 6 (six) hours as needed for up to 12 doses for severe pain. 12 tablet 0   No facility-administered medications prior to visit.        Objective:   Physical Exam Vitals:   12/27/23 1504 12/27/23 1505  BP: (!) 149/84 (!) 141/83  Pulse: 64   SpO2: 98%   Weight: 249 lb (112.9 kg)   Height: 5\' 9"  (1.753 m)     Gen: Pleasant, well-nourished, in no distress,  normal affect  ENT: No lesions,  mouth clear,  oropharynx clear, no postnasal drip  Neck: No JVD, no stridor  Lungs: No use of accessory muscles, no crackles or wheezing on normal respiration, no wheeze on forced expiration  Cardiovascular: RRR, heart sounds normal, no murmur or gallops, no peripheral edema  Musculoskeletal: No deformities, no cyanosis or clubbing  Neuro: alert, awake, non  focal  Skin: Warm, no lesions or rash       Assessment & Plan:   Pulmonary nodules Scattered micronodular disease especially at the left base as well as a more discrete 5 mm pulmonary nodule in the left lower lobe.  That nodule is stable over a 1 year interval.  He has low risk, is a never smoker.  You can make an argument to defer any follow-up and deem the nodule benign.  Given the micronodular disease, question possible smoldering inflammatory process, I think it is reasonable to check another CT in 1 year.  If we have 2 years of stability then we can probably defer any further follow-up unless he develops symptoms.  We reviewed your CT scans of the chest today. Please get a repeat CT scan of the chest without contrast at the [place] in November 2025 to compare with your priors. Please call our office and follow-up if you develop any new respiratory symptoms including change in breathing, cough, mucus production.  Call if you develop any systemic symptoms like fevers, chills, sweats.  If these symptoms develop then we might decide to check your CT scan of the chest sooner Follow Dr. Delton Coombes in December 2025 to review your CT scan results.   [name], MD, PhD 12/27/2023, 3:31 PM [facility] [phone] or if no answer before 7:00PM call [phone] For any issues after 7:00PM please call eLink [phone]
